# Patient Record
Sex: Male | Born: 1975 | Race: White | Hispanic: No | Marital: Married | Smoking: Never smoker
Health system: Southern US, Community
[De-identification: ages and names within clinical notes are randomized; demographics above are authoritative.]

---

## 2021-07-11 ENCOUNTER — Encounter (HOSPITAL_COMMUNITY): Payer: Self-pay | Admitting: Emergency Medicine

## 2021-07-11 ENCOUNTER — Other Ambulatory Visit: Payer: Self-pay

## 2021-07-11 ENCOUNTER — Emergency Department (HOSPITAL_COMMUNITY): Payer: BC Managed Care – PPO

## 2021-07-11 ENCOUNTER — Emergency Department (HOSPITAL_COMMUNITY)
Admission: EM | Admit: 2021-07-11 | Discharge: 2021-07-11 | Disposition: A | Discharge status: Home / Self Care | Payer: BC Managed Care – PPO | Attending: Emergency Medicine | Admitting: Emergency Medicine

## 2021-07-11 DIAGNOSIS — R531 Weakness: Secondary | ICD-10-CM | POA: Diagnosis not present

## 2021-07-11 DIAGNOSIS — S4991XA Unspecified injury of right shoulder and upper arm, initial encounter: Secondary | ICD-10-CM | POA: Diagnosis present

## 2021-07-11 DIAGNOSIS — S46221A Laceration of muscle, fascia and tendon of other parts of biceps, right arm, initial encounter: Secondary | ICD-10-CM | POA: Insufficient documentation

## 2021-07-11 DIAGNOSIS — X500XXA Overexertion from strenuous movement or load, initial encounter: Secondary | ICD-10-CM | POA: Diagnosis not present

## 2021-07-11 DIAGNOSIS — S46211A Strain of muscle, fascia and tendon of other parts of biceps, right arm, initial encounter: Secondary | ICD-10-CM

## 2021-07-11 MED ORDER — HYDROCODONE-ACETAMINOPHEN 5-325 MG PO TABS
1.0000 | ORAL_TABLET | Freq: Once | ORAL | Status: AC
  Administered 2021-07-11: 1 via ORAL
  Filled 2021-07-11: qty 1

## 2021-07-11 NOTE — Discharge Instructions (Signed)
You are to follow up at [facility] to get MRI today upon discharge. Please return if you develop any further concerns.

## 2021-07-11 NOTE — Progress Notes (Signed)
° ° °  Subjective:  Andre Walsh is a 46 y.o. male,   who had immediate right elbow pain after lifting heavy boxes with a rolling sensation of the bicep. No previous issues. No shoulder pain. No N/T. Reports mild to moderate pain.      Objective:   VITALS:   Vitals:   07/11/21 1232 07/11/21 1239 07/11/21 1300  BP:   125/87  Pulse: 73  73  Resp: 20    SpO2: 97%  96%  Weight:  106.6 kg   Height:  6' (1.829 m)     Constitutional: General Appearance: healthy-appearing, well-nourished, and well-developed. Level of Distress: no acute distress. Eyes: Lens (normal) clear: both eyes. Head: Head: normocephalic and atraumatic. Lungs: Respiratory effort: no dyspnea. Skin: Inspection and palpation: no rash, lesions Neurologic: Cranial Nerves: grossly intact. Sensation: grossly intact. Psychiatric: Insight: good judgement and insight. Mental Status: normal mood and affect and active and alert. Orientation: to time, place, and person.  Right Upper Extremity / ELBOW Inspection: skin intact, cool/dry, no lesions Reverse pop eye sign  Distal bicep not present on hook test.  Normal ROM.    Able to flex/ext/abd/add all digits Pulses normal, temperature warm, refill normal   No results found for: WBC, HGB, HCT, MCV, PLT BMET No results found for: NA, K, CL, CO2, GLUCOSE, BUN, CREATININE, CALCIUM, EGFR, GFRNONAA   Assessment/Plan:     Active Problems:   * No active hospital problems. *  Right elbow pain, likely distal biceps rupture  Exam concerning for distal biceps rupture. STAT MRI of the right elbow placed in [facility] system for outpatient imaging and we will follow up shortly after for MRI review. Advised to call our office Monday for MRI scheduling.   Sling as needed for comfort, avoid lifting over 3-5 pounds . Motion is ok.     Faythe Casa 07/11/2021, 2:06 PM  Jonelle Sidle PA-C  Physician Assistant with Dr. Lillia Abed [place]

## 2021-07-11 NOTE — ED Provider Notes (Signed)
[facility] EMERGENCY DEPARTMENT Provider Note   CSN: [id number] Arrival date & time: 07/11/21  1222     History  Chief Complaint  Patient presents with   Arm Injury    Andre Walsh is a 46 y.o. male.  Presents the emergency room with complaint of right upper arm pain.  He states he was lifting really heavy boxes today when he shifted the way into his right arm.  As he did this, he felt his bicep pull and felt like it was pulling up his arm.  The pain is progressively gotten worse.  He also feels like his bicep is shortened.  He denies any numbness or tingling.  He is able to range his elbow and shoulder, however he has some weakness in this arm.   Arm Injury     Home Medications Prior to Admission medications   Not on File      Allergies    Patient has no known allergies.    Review of Systems   Review of Systems  Musculoskeletal:  Positive for myalgias.  Neurological:  Positive for weakness. Negative for numbness.  All other systems reviewed and are negative.  Physical Exam Updated Vital Signs BP (!) 139/92 (BP Location: Left Arm)    Pulse 61    Temp 98 F (36.7 C) (Oral)    Resp 16    Ht 6' (1.829 m)    Wt 106.6 kg    SpO2 100%    BMI 31.87 kg/m  Physical Exam Vitals and nursing note reviewed.  Constitutional:      General: He is not in acute distress.    Appearance: Normal appearance. He is well-developed. He is not ill-appearing, toxic-appearing or diaphoretic.  HENT:     Head: Normocephalic and atraumatic.     Nose: No nasal deformity.     Mouth/Throat:     Lips: Pink. No lesions.  Eyes:     General: Gaze aligned appropriately. No scleral icterus.       Right eye: No discharge.        Left eye: No discharge.     Conjunctiva/sclera: Conjunctivae normal.     Right eye: Right conjunctiva is not injected. No exudate or hemorrhage.    Left eye: Left conjunctiva is not injected. No exudate or hemorrhage. Pulmonary:     Effort: Pulmonary  effort is normal. No respiratory distress.  Musculoskeletal:     Right shoulder: Normal. No swelling, deformity or tenderness. Normal range of motion. Normal strength. Normal pulse.     Right upper arm: Deformity and tenderness present.     Comments: Bicep appears shortened on the proximal aspect especially when compared to the left bicep.  Patient is able to range his elbow fully.  He is able to partially flex, however this is not near the flexion what we are getting on his left arm.  Radial pulses are 2+ bilaterally.  Sensation is grossly intact in the medial, radial, and ulnar distribution.  Skin:    General: Skin is warm and dry.  Neurological:     Mental Status: He is alert and oriented to person, place, and time.  Psychiatric:        Mood and Affect: Mood normal.        Speech: Speech normal.        Behavior: Behavior normal. Behavior is cooperative.    ED Results / Procedures / Treatments   Labs (all labs ordered are listed, but only abnormal results  are displayed) Labs Reviewed - No data to display  EKG None  Radiology DG Elbow Complete Right  Result Date: 07/11/2021 CLINICAL DATA:  Biceps injury while lifting a heavy box. EXAM: RIGHT ELBOW - COMPLETE 3+ VIEW COMPARISON:  None. FINDINGS: There is no evidence of fracture, dislocation, or joint effusion. There is no evidence of arthropathy or other focal bone abnormality. Soft tissues are unremarkable. IMPRESSION: Negative. Electronically Signed   By: Kennith Center M.D.   On: 07/11/2021 13:17    Procedures Procedures   Medications Ordered in ED Medications  HYDROcodone-acetaminophen (NORCO/VICODIN) 5-325 MG per tablet 1 tablet (1 tablet Oral Given 07/11/21 1300)    ED Course/ Medical Decision Making/ A&P                           Medical Decision Making Problems Addressed: Tear of right biceps muscle, initial encounter: acute illness or injury that poses a threat to life or bodily functions  Amount and/or Complexity of  Data Reviewed Independent Historian:     Details: independent Radiology: ordered and independent interpretation performed. Decision-making details documented in ED Course.  Risk Prescription drug management.   This is a 46 y.o. male presents to the ED with concern for a right upper arm injury. Exam concerning for bicep shortening. This could be indicative of a bicep tear on the distal end. He is still able to flex and extend, but has decreased strength. Difficult to supinate v pronate  Exam suspicious for distal biceps tendon tear. Plan to obtain elbow xr to rule out other causes of elbow pain.  XR negative  I spoke with Dion Saucier, PA-C from [facility]. He will plan on going to see patient to evaluate possible distal biceps tendon tear.   Orthopedic PA has evaluated patient. He feels this is likely a distal biceps tear or rupture. Recommends same day MRI at [facility] and a shoulder sling for comfort.   I have discussed this case with my attending physician who agrees and has made changes to the plan accordingly.  Portions of this note were generated with Scientist, clinical (histocompatibility and immunogenetics). Dictation errors may occur despite best attempts at proofreading.   Final Clinical Impression(s) / ED Diagnoses Final diagnoses:  Tear of right biceps muscle, initial encounter    Rx / DC Orders ED Discharge Orders     None         Claudie Leach, PA-C 07/11/21 1446    Tegeler, Canary Brim, MD 07/11/21 1524

## 2021-07-11 NOTE — ED Triage Notes (Addendum)
Pt states he was picking up a heavy box and shifted the weight to his right arm. When he did this, he felt his bicep pull- and then a sensation like "it was rolling up." It has become progressively more painful. Pt has sensation and movement present in right arm.

## 2021-12-01 ENCOUNTER — Ambulatory Visit: Payer: BC Managed Care – PPO | Admitting: Gastroenterology

## 2023-01-21 IMAGING — DX DG ELBOW COMPLETE 3+V*R*
1 series · 4 of 4 positions shown · non-contrast
Comparison: None.

CLINICAL DATA: Biceps injury while lifting a heavy box.

EXAM:
RIGHT ELBOW - COMPLETE 3+ VIEW

[Series 1: elbow · 0.14mm/px · 4 of 4 slices shown]
[im 1/4]
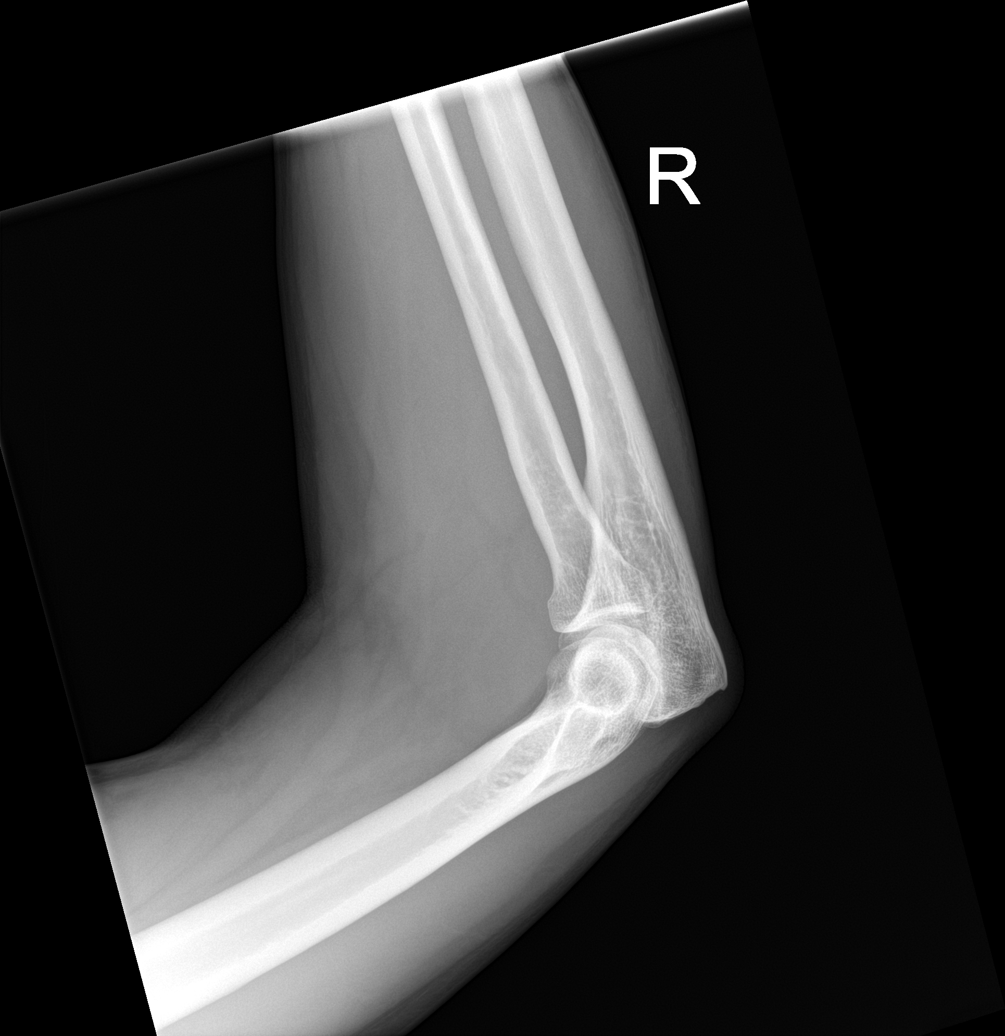
[im 2/4]
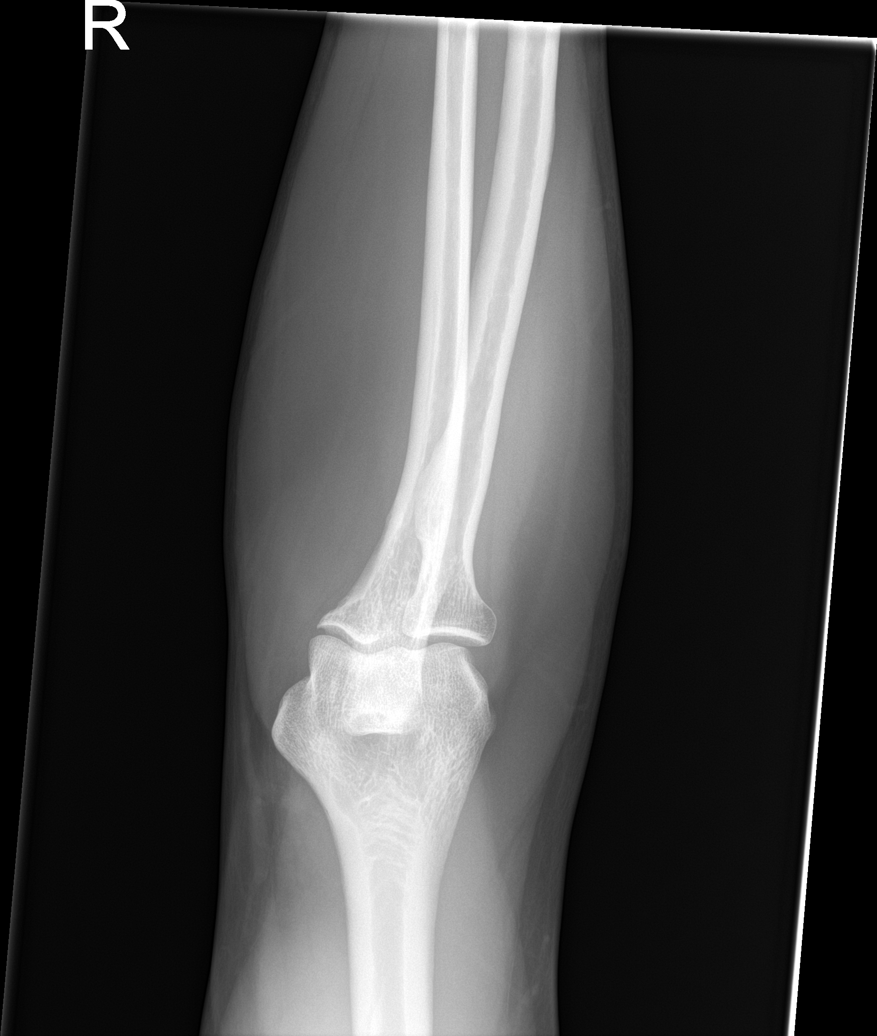
[im 3/4]
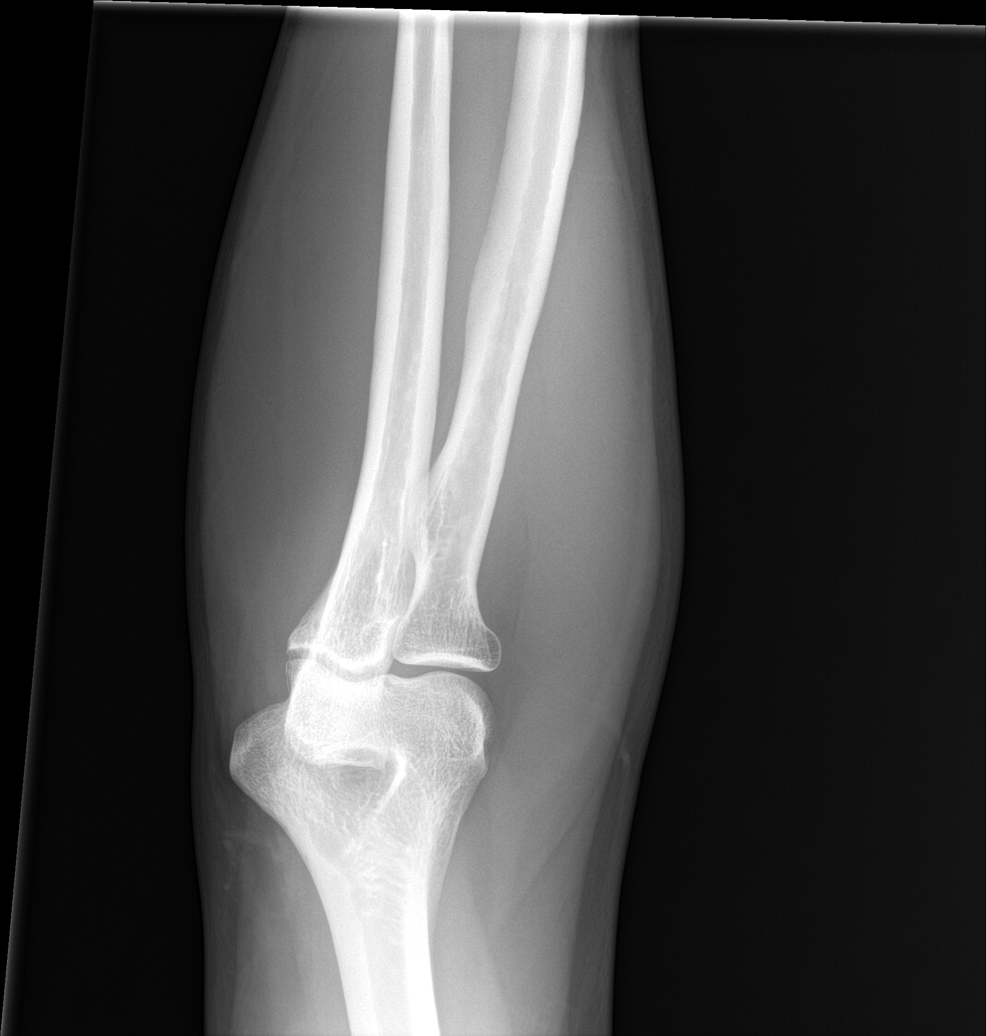
[im 4/4]
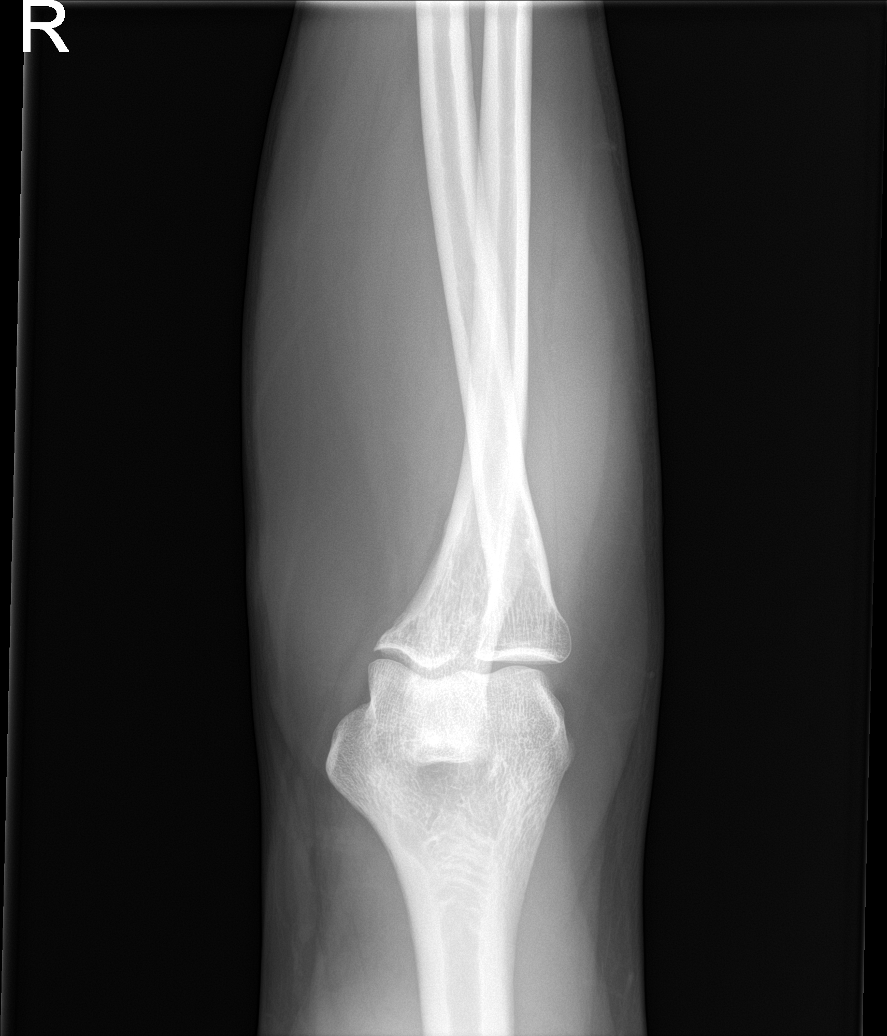

[4 of 4 positions shown; findings below may reference images not displayed]

FINDINGS: There is no evidence of fracture, dislocation, or joint effusion.
There is no evidence of arthropathy or other focal bone abnormality.
Soft tissues are unremarkable.
IMPRESSION: Negative.
